# Patient Record
Sex: Male | Born: 1970 | Race: Black or African American | Hispanic: No | Marital: Married | Smoking: Never smoker
Health system: Southern US, Community
[De-identification: ages and names within clinical notes are randomized; demographics above are authoritative.]

## PROBLEM LIST (undated history)

## (undated) DIAGNOSIS — M5430 Sciatica, unspecified side: Secondary | ICD-10-CM

---

## 2016-08-02 ENCOUNTER — Ambulatory Visit (HOSPITAL_COMMUNITY)
Admission: EM | Admit: 2016-08-02 | Discharge: 2016-08-02 | Disposition: A | Discharge status: Home / Self Care | Payer: BLUE CROSS/BLUE SHIELD | Attending: Family Medicine | Admitting: Family Medicine

## 2016-08-02 DIAGNOSIS — S70362A Insect bite (nonvenomous), left thigh, initial encounter: Secondary | ICD-10-CM

## 2016-08-02 DIAGNOSIS — W57XXXA Bitten or stung by nonvenomous insect and other nonvenomous arthropods, initial encounter: Secondary | ICD-10-CM | POA: Diagnosis not present

## 2016-08-02 NOTE — Discharge Instructions (Signed)
Keep the area clean with soap and water. If you develop increased redness, itching or local swelling he may apply hydrocortisone 1% cream and/or Benadryl cream. Apply ice, packs to the area as well. If in the next few days she developed fever, chills, weakness or symptoms similar to a flulike illness seek medical attention promptly.

## 2016-08-02 NOTE — ED Triage Notes (Signed)
Pt states that he has a spider bite on his left thigh pt noticed it 2 days ago some swelling and redness pt does not know what type of spider it was

## 2016-08-02 NOTE — ED Provider Notes (Signed)
CSN: [id number]     Arrival date & time 08/02/16  1342 History   First MD Initiated Contact with Patient 08/02/16 1527     Chief Complaint  Patient presents with  . Insect Bite   (Consider location/radiation/quality/duration/timing/severity/associated sxs/prior Treatment) 46 year old male complains of a spider bite to the left proximal lateral thigh occurring 3 days ago. He works outside as a Administratorlandscaper. He never saw a spider or any other insect. He did feel something bite him though. He denies systemic symptoms. Denies feeling bad, no fever, chills causing all nausea or vomiting or other constitutional symptoms      No past medical history on file. No past surgical history on file. No family history on file. Social History  Substance Use Topics  . Smoking status: Not on file  . Smokeless tobacco: Not on file  . Alcohol use Not on file    Review of Systems  Constitutional: Negative.   HENT: Negative.   Respiratory: Negative for shortness of breath and wheezing.   Gastrointestinal: Negative.   Skin: Negative for rash.  Neurological: Negative for dizziness and headaches.  All other systems reviewed and are negative.   Allergies  Patient has no known allergies.  Home Medications   Prior to Admission medications   Not on File   Meds Ordered and Administered this Visit  Medications - No data to display  BP 123/71 (BP Location: Left Arm)   Pulse 63   Temp 98.3 F (36.8 C) (Oral)   SpO2 100%  No data found.   Physical Exam  Constitutional: He is oriented to person, place, and time. He appears well-developed and well-nourished. No distress.  Eyes: EOM are normal.  Neck: Normal range of motion. Neck supple.  Cardiovascular: Normal rate and regular rhythm.   Pulmonary/Chest: Effort normal and breath sounds normal. No respiratory distress.  Musculoskeletal: He exhibits no edema.  Neurological: He is alert and oriented to person, place, and time. He exhibits normal  muscle tone.  Skin: Skin is warm and dry.  Patient points to a small area with hair curling around it to the left lateral thigh. No actual lesion is seen by the provider. No erythema, no raised area, no draining, no bleeding, no pustule or papule. No cellulitis or other signs of infection. Approximately 3 cm medially there is a small 1 mm warty-like structure with hair growing through it. This does not appear to be any sort of bite.  Psychiatric: He has a normal mood and affect.  Nursing note and vitals reviewed.   Urgent Care Course     Procedures (including critical care time)  Labs Review Labs Reviewed - No data to display  Imaging Review No results found.   Visual Acuity Review  Right Eye Distance:   Left Eye Distance:   Bilateral Distance:    Right Eye Near:   Left Eye Near:    Bilateral Near:         MDM   1. Insect bite, initial encounter    Keep the area clean with soap and water. If you develop increased redness, itching or local swelling he may apply hydrocortisone 1% cream and/or Benadryl cream. Apply ice, packs to the area as well. If in the next few days she developed fever, chills, weakness or symptoms similar to a flulike illness seek medical attention promptly.     Hayden RasmussenMabe, Jezelle Gullick, NP 08/02/16 [phone]

## 2018-10-11 ENCOUNTER — Ambulatory Visit (HOSPITAL_COMMUNITY)
Admission: EM | Admit: 2018-10-11 | Discharge: 2018-10-11 | Disposition: A | Discharge status: Home / Self Care | Payer: BC Managed Care – PPO | Attending: Family Medicine | Admitting: Family Medicine

## 2018-10-11 ENCOUNTER — Other Ambulatory Visit: Payer: Self-pay

## 2018-10-11 ENCOUNTER — Encounter (HOSPITAL_COMMUNITY): Payer: Self-pay

## 2018-10-11 DIAGNOSIS — L03011 Cellulitis of right finger: Secondary | ICD-10-CM | POA: Diagnosis not present

## 2018-10-11 MED ORDER — LIDOCAINE HCL 2 % IJ SOLN
INTRAMUSCULAR | Status: AC
  Filled 2018-10-11: qty 20

## 2018-10-11 NOTE — ED Provider Notes (Signed)
[facility]    CSN: [id number] Arrival date & time: 10/11/18  1839      History   Chief Complaint Chief Complaint  Patient presents with  . Hand Pain    HPI Malik Boone is a 48 y.o. male.   He is presenting with right index finger pain.  He is having swelling and pain around the nail of the index finger.  Is been ongoing for a few days.  Pain seems to be getting worse.  Pain is becoming more constant.  Is localized to the dorsal aspect of the distal phalanx.  No overlying redness or streaking.  No injury or inciting event.  Has not take anything for the pain.  HPI  History reviewed. No pertinent past medical history.  There are no active problems to display for this patient.   History reviewed. No pertinent surgical history.     Home Medications    Prior to Admission medications   Not on File    Family History Family History  Problem Relation Age of Onset  . Healthy Mother     Social History Social History   Tobacco Use  . Smoking status: Never Smoker  . Smokeless tobacco: Never Used  Substance Use Topics  . Alcohol use: Yes    Comment: socially  . Drug use: Never     Allergies   Patient has no known allergies.   Review of Systems Review of Systems  Constitutional: Negative for fever.  HENT: Negative for congestion.   Respiratory: Negative for cough.   Cardiovascular: Negative for chest pain.  Gastrointestinal: Negative for abdominal pain.  Musculoskeletal: Negative for gait problem.  Skin: Negative for color change.  Neurological: Negative for weakness.  Hematological: Negative for adenopathy.     Physical Exam Triage Vital Signs ED Triage Vitals  Enc Vitals Group     BP 10/11/18 1858 126/68     Pulse --      Resp 10/11/18 1858 16     Temp 10/11/18 1858 98.2 F (36.8 C)     Temp Source 10/11/18 1858 Oral     SpO2 10/11/18 1858 100 %     Weight --      Height --      Head Circumference --      Peak Flow --      Pain  Score 10/11/18 1856 7     Pain Loc --      Pain Edu? --      Excl. in GC? --    No data found.  Updated Vital Signs BP 126/68 (BP Location: Left Arm)   Temp 98.2 F (36.8 C) (Oral)   Resp 16   SpO2 100%   Visual Acuity Right Eye Distance:   Left Eye Distance:   Bilateral Distance:    Right Eye Near:   Left Eye Near:    Bilateral Near:     Physical Exam Gen: NAD, alert, cooperative with exam, well-appearing ENT: normal lips, normal nasal mucosa,  Eye: normal EOM, normal conjunctiva and lids CV:  no edema, +2 pedal pulses   Resp: no accessory muscle use, non-labored,  Skin: no rashes, no areas of induration  Neuro: normal tone, normal sensation to touch Psych:  normal insight, alert and oriented MSK:  Right hand: Tenderness and fluctuance on the radial aspect of the nail. No streaking. Normal finger range of motion. No malrotation or misalignment. Neurovascular intact  Incision and Drainage Procedure Note:  The affected area was cleaned  and draped in a sterile fashion. Anesthesia was achieved using 6 mL of 1% Lidocaine without epinephrine injected to achieve a digital block of the right index finger using a 25-guage 1.5 inch needle. An 11-blade scalpel was used to incise the wound. A culture was obtained.  A Q-tip was used to break up any loculations. A sterile dressing was applied to the area. The patient tolerated the procedure well. No complications were encountered.     UC Treatments / Results  Labs (all labs ordered are listed, but only abnormal results are displayed) Labs Reviewed  AEROBIC CULTURE (SUPERFICIAL SPECIMEN)    EKG   Radiology No results found.  Procedures Procedures (including critical care time)  Medications Ordered in UC Medications  lidocaine (XYLOCAINE) 2 % (with pres) injection (has no administration in time range)    Initial Impression / Assessment and Plan / UC Course  I have reviewed the triage vital signs and the nursing  notes.  Pertinent labs & imaging results that were available during my care of the patient were reviewed by me and considered in my medical decision making (see chart for details).     Malik Boone is a 48 year old male that is presenting with symptoms consistent with paronychia.  Drainage was achieved today and wound culture was sent.  Counseled on supportive care.  Given indications return to follow-up.  Final Clinical Impressions(s) / UC Diagnoses   Final diagnoses:  Paronychia of finger of right hand     Discharge Instructions     Please soak the area in water and soap  Please keep it clean      ED Prescriptions    None     Controlled Substance Prescriptions [place] Controlled Substance Registry consulted? Not Applicable   Malik Ax, MD 10/11/18 2007

## 2018-10-11 NOTE — ED Triage Notes (Signed)
Patient presents to Urgent Care with complaints of right index finger apin since last week. 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

## 2018-10-11 NOTE — Discharge Instructions (Signed)
Please soak the area in water and soap  Please keep it clean

## 2018-10-14 LAB — AEROBIC CULTURE W GRAM STAIN (SUPERFICIAL SPECIMEN)

## 2018-10-15 ENCOUNTER — Telehealth (HOSPITAL_COMMUNITY): Payer: Self-pay | Admitting: Emergency Medicine

## 2018-10-15 NOTE — Telephone Encounter (Signed)
Attempted to reach patient to see how he was doing, no answer, left VM to return call if not feeling better.

## 2018-10-28 ENCOUNTER — Other Ambulatory Visit: Payer: Self-pay

## 2018-10-28 DIAGNOSIS — Z20822 Contact with and (suspected) exposure to covid-19: Secondary | ICD-10-CM

## 2018-10-29 LAB — NOVEL CORONAVIRUS, NAA: SARS-CoV-2, NAA: NOT DETECTED

## 2020-08-01 ENCOUNTER — Encounter (HOSPITAL_COMMUNITY): Payer: Self-pay

## 2020-08-01 ENCOUNTER — Ambulatory Visit (HOSPITAL_COMMUNITY)
Admission: EM | Admit: 2020-08-01 | Discharge: 2020-08-01 | Disposition: A | Discharge status: Home / Self Care | Payer: No Typology Code available for payment source | Attending: Physician Assistant | Admitting: Physician Assistant

## 2020-08-01 ENCOUNTER — Other Ambulatory Visit: Payer: Self-pay

## 2020-08-01 DIAGNOSIS — R1010 Upper abdominal pain, unspecified: Secondary | ICD-10-CM | POA: Insufficient documentation

## 2020-08-01 DIAGNOSIS — R1013 Epigastric pain: Secondary | ICD-10-CM | POA: Insufficient documentation

## 2020-08-01 LAB — COMPREHENSIVE METABOLIC PANEL
ALT: 14 U/L (ref 0–44)
AST: 21 U/L (ref 15–41)
Albumin: 4.1 g/dL (ref 3.5–5.0)
Alkaline Phosphatase: 50 U/L (ref 38–126)
Anion gap: 8 (ref 5–15)
BUN: 20 mg/dL (ref 6–20)
CO2: 26 mmol/L (ref 22–32)
Calcium: 9.3 mg/dL (ref 8.9–10.3)
Chloride: 104 mmol/L (ref 98–111)
Creatinine, Ser: 1.36 mg/dL — ABNORMAL HIGH (ref 0.61–1.24)
GFR, Estimated: >60 mL/min (ref >60)
Glucose, Bld: 88 mg/dL (ref 70–99)
Potassium: 3.9 mmol/L (ref 3.5–5.1)
Sodium: 138 mmol/L (ref 135–145)
Total Bilirubin: 0.8 mg/dL (ref 0.3–1.2)
Total Protein: 6.9 g/dL (ref 6.5–8.1)

## 2020-08-01 LAB — CBC WITH DIFFERENTIAL/PLATELET
Abs Immature Granulocytes: 0.01 10*3/uL (ref 0.00–0.07)
Basophils Absolute: 0 10*3/uL (ref 0.0–0.1)
Basophils Relative: 1 %
Eosinophils Absolute: 0 10*3/uL (ref 0.0–0.5)
Eosinophils Relative: 1 %
HCT: 42.1 % (ref 39.0–52.0)
Hemoglobin: 13.9 g/dL (ref 13.0–17.0)
Immature Granulocytes: 0 %
Lymphocytes Relative: 38 %
Lymphs Abs: 1.1 10*3/uL (ref 0.7–4.0)
MCH: 28.1 pg (ref 26.0–34.0)
MCHC: 33 g/dL (ref 30.0–36.0)
MCV: 85.1 fL (ref 80.0–100.0)
Monocytes Absolute: 0.3 10*3/uL (ref 0.1–1.0)
Monocytes Relative: 11 %
Neutro Abs: 1.4 10*3/uL — ABNORMAL LOW (ref 1.7–7.7)
Neutrophils Relative %: 49 %
Platelets: 211 10*3/uL (ref 150–400)
RBC: 4.95 MIL/uL (ref 4.22–5.81)
RDW: 15.4 % (ref 11.5–15.5)
WBC: 2.8 10*3/uL — ABNORMAL LOW (ref 4.0–10.5)
nRBC: 0 % (ref 0.0–0.2)

## 2020-08-01 LAB — POCT URINALYSIS DIPSTICK, ED / UC
Bilirubin Urine: NEGATIVE
Glucose, UA: NEGATIVE mg/dL
Hgb urine dipstick: NEGATIVE
Leukocytes,Ua: NEGATIVE
Nitrite: NEGATIVE
Protein, ur: NEGATIVE mg/dL
Specific Gravity, Urine: >=1.03 (ref 1.005–1.030)
Urobilinogen, UA: 0.2 mg/dL (ref 0.0–1.0)
pH: 5.5 (ref 5.0–8.0)

## 2020-08-01 LAB — LIPASE, BLOOD: Lipase: 37 U/L (ref 11–51)

## 2020-08-01 MED ORDER — FAMOTIDINE 40 MG PO TABS: 40.0000 mg | ORAL_TABLET | Freq: Every day | ORAL | 0 refills | Status: AC

## 2020-08-01 NOTE — ED Triage Notes (Signed)
C/o abdominal pain (aching), sharp pain in head (intermitten), Blurred vision (no change from past), denies shoulder, jaw and chest pain. LBM: today  Started: last week (Tuesday) Interventions: pepto bismol with some relief

## 2020-08-01 NOTE — Discharge Instructions (Addendum)
Your urine showed that you are not eating enough but is otherwise normal.  Your white blood cells were slightly low which can be a result of a viral infection.  I do recommend that you have this rechecked within a week.  If you develop any fever, nausea/vomiting, weakness, lightheadedness, cough, urinary symptoms you need to go to the emergency room.  Start Pepcid to help coat your stomach.  We will contact you if your additional lab work is abnormal.  Please avoid spicy/acidic/fatty foods and drink plenty of water.  I recommend eating small frequent meals that are easy to digest.  If you have any worsening symptoms as we discussed you need to go to the emergency room for imaging.  Avoid alcohol and ibuprofen like medications (NSAIDs).

## 2020-08-01 NOTE — ED Provider Notes (Signed)
[facility]    CSN: [id number] Arrival date & time: 08/01/20  0900      History   Chief Complaint Chief Complaint  Patient presents with   Abdominal Pain   Pain to head    HPI Malik Boone is a 50 y.o. male.   Patient presents today with a weeklong history of intermittent upper abdominal pain.  He reports pain is currently rated 3 on a 0-10 pain scale but increases to a 7 at different times, localized to epigastrium and throughout upper abdomen, described as aching, no aggravating or alleviating factors identified.  Patient has not identified any triggers.  He has been eating a bland diet which provided some relief of symptoms.  He denies any nausea, vomiting, diarrhea, melena, hematochezia, dysphagia, odynophagia.  He denies previous abdominal surgery.  Denies history of gastrointestinal disorder including Crohn's disease, peptic ulcer disease, GERD.  He has tried Pepto-Bismol which provided relief of symptoms temporarily.  He has not seen a GI specialist in the past.  He denies regular caffeine, NSAID, alcohol consumption.  He does not have a primary care provider but intends to establish with 1 in the near future.  He has been able to eat and drink despite symptoms.   History reviewed. No pertinent past medical history.  There are no problems to display for this patient.   History reviewed. No pertinent surgical history.     Home Medications    Prior to Admission medications   Medication Sig Start Date End Date Taking? Authorizing Provider  famotidine (PEPCID) 40 MG tablet Take 1 tablet (40 mg total) by mouth at bedtime. 08/01/20  Yes Crewe Heathman, Noberto Retort, PA-C    Family History Family History  Problem Relation Age of Onset   Healthy Mother     Social History Social History   Tobacco Use   Smoking status: Never   Smokeless tobacco: Never  Vaping Use   Vaping Use: Never used  Substance Use Topics   Alcohol use: Yes    Comment: socially   Drug use: Never      Allergies   Patient has no known allergies.   Review of Systems Review of Systems  Constitutional:  Positive for activity change. Negative for appetite change, fatigue and fever.  Respiratory:  Negative for cough and shortness of breath.   Cardiovascular:  Negative for chest pain.  Gastrointestinal:  Positive for abdominal pain. Negative for blood in stool, constipation, diarrhea, nausea and vomiting.  Genitourinary:  Negative for dysuria, frequency, hematuria and urgency.  Musculoskeletal:  Negative for arthralgias and myalgias.  Neurological:  Positive for headaches (intermittent). Negative for dizziness and light-headedness.    Physical Exam Triage Vital Signs ED Triage Vitals  Enc Vitals Group     BP 08/01/20 1010 118/78     Pulse Rate 08/01/20 1010 68     Resp 08/01/20 1010 16     Temp 08/01/20 1010 98.3 F (36.8 C)     Temp Source 08/01/20 1010 Oral     SpO2 08/01/20 1010 97 %     Weight --      Height --      Head Circumference --      Peak Flow --      Pain Score 08/01/20 1006 7     Pain Loc --      Pain Edu? --      Excl. in GC? --    No data found.  Updated Vital Signs BP 118/78 (BP Location:  Left Arm)   Pulse 68   Temp 98.3 F (36.8 C) (Oral)   Resp 16   SpO2 97%   Visual Acuity Right Eye Distance:   Left Eye Distance:   Bilateral Distance:    Right Eye Near:   Left Eye Near:    Bilateral Near:     Physical Exam Vitals reviewed.  Constitutional:      General: He is awake.     Appearance: Normal appearance. He is normal weight. He is not ill-appearing.     Comments: Very pleasant male appears stated age in no acute distress sitting comfortably on exam table  HENT:     Head: Normocephalic and atraumatic.  Cardiovascular:     Rate and Rhythm: Normal rate and regular rhythm.     Heart sounds: Normal heart sounds, S1 normal and S2 normal. No murmur heard. Pulmonary:     Effort: Pulmonary effort is normal.     Breath sounds: Normal  breath sounds. No stridor. No wheezing, rhonchi or rales.     Comments: Clear to auscultation bilaterally Abdominal:     General: Bowel sounds are normal.     Palpations: Abdomen is soft.     Tenderness: There is no abdominal tenderness. There is no right CVA tenderness, left CVA tenderness, guarding or rebound. Negative signs include Murphy's sign.     Comments: Benign abdominal exam.  No evidence of acute abdomen on physical exam.  No tenderness palpation. Negative Murphy sign.    Neurological:     Mental Status: He is alert.  Psychiatric:        Behavior: Behavior is cooperative.     UC Treatments / Results  Labs (all labs ordered are listed, but only abnormal results are displayed) Labs Reviewed  CBC WITH DIFFERENTIAL/PLATELET - Abnormal; Notable for the following components:      Result Value   WBC 2.8 (*)    Neutro Abs 1.4 (*)    All other components within normal limits  POCT URINALYSIS DIPSTICK, ED / UC - Abnormal; Notable for the following components:   Ketones, ur TRACE (*)    All other components within normal limits  COMPREHENSIVE METABOLIC PANEL  LIPASE, BLOOD    EKG   Radiology No results found.  Procedures Procedures (including critical care time)  Medications Ordered in UC Medications - No data to display  Initial Impression / Assessment and Plan / UC Course  I have reviewed the triage vital signs and the nursing notes.  Pertinent labs & imaging results that were available during my care of the patient were reviewed by me and considered in my medical decision making (see chart for details).      UA showed trace ketones but was otherwise normal.  CBC obtained showed leukopenia without additional abnormalities.  CMP and lipase pending.  We will start patient on Pepcid and he was instructed to eat a bland diet and drink plenty of fluid.  Discussed that we are unable to obtain imaging in urgent care and if he has any persistent or worsening symptoms he  needs to go to the emergency room.  He was given contact information for local GI specialist should symptoms persist but not worsen.  Discussed alarm symptoms that warrant emergent evaluation.  Strict return precautions given to which patient expressed understanding.  Final Clinical Impressions(s) / UC Diagnoses   Final diagnoses:  Upper abdominal pain  Epigastric abdominal pain     Discharge Instructions      Your  urine showed that urinating otherwise normal.  Your white blood cells were slightly low which can be a result of a viral infection.  I do recommend that you have this rechecked within a week.  If you develop any fever, nausea/vomiting, weakness, lightheadedness, cough, urinary symptoms you need to go to the emergency room.  Start Pepcid to help coat your stomach.  We will contact you if your additional lab work is abnormal.  Please avoid spicy/acidic/fatty foods and drink plenty of water.  I recommend eating small frequent meals that are easy to digest.  If you have any worsening symptoms as we discussed you need to go to the emergency room for imaging.  Avoid alcohol and ibuprofen like medications (NSAIDs).     ED Prescriptions     Medication Sig Dispense Auth. Provider   famotidine (PEPCID) 40 MG tablet Take 1 tablet (40 mg total) by mouth at bedtime. 30 tablet Lindia Garms, Noberto Retort, PA-C      PDMP not reviewed this encounter.   Jeani Hawking, PA-C 08/01/20 1218

## 2020-08-14 ENCOUNTER — Ambulatory Visit (HOSPITAL_COMMUNITY)
Admission: EM | Admit: 2020-08-14 | Discharge: 2020-08-14 | Disposition: A | Discharge status: Home / Self Care | Payer: No Typology Code available for payment source | Attending: Emergency Medicine | Admitting: Emergency Medicine

## 2020-08-14 ENCOUNTER — Other Ambulatory Visit: Payer: Self-pay

## 2020-08-14 ENCOUNTER — Encounter (HOSPITAL_COMMUNITY): Payer: Self-pay

## 2020-08-14 ENCOUNTER — Ambulatory Visit (INDEPENDENT_AMBULATORY_CARE_PROVIDER_SITE_OTHER): Payer: No Typology Code available for payment source

## 2020-08-14 DIAGNOSIS — M79671 Pain in right foot: Secondary | ICD-10-CM

## 2020-08-14 NOTE — ED Triage Notes (Signed)
Pt presents with right foot pain X 1 week.   States his foot has been very tender.

## 2020-08-14 NOTE — ED Provider Notes (Signed)
[facility]    CSN: [id number] Arrival date & time: 08/14/20  0850      History   Chief Complaint Chief Complaint  Patient presents with   Foot Pain    HPI Malik Boone is a 50 y.o. male.   1 week ago noticed pain to rt foot on bottom side under small toe. States that his wife used tweezers to pick and remove something . Pt denies any known injury. It is worse when he wears his work boots. Denies any fevers. Has not taken anything pta.    History reviewed. No pertinent past medical history.  There are no problems to display for this patient.   History reviewed. No pertinent surgical history.     Home Medications    Prior to Admission medications   Medication Sig Start Date End Date Taking? Authorizing Provider  famotidine (PEPCID) 40 MG tablet Take 1 tablet (40 mg total) by mouth at bedtime. 08/01/20   Raspet, Noberto Retort, PA-C    Family History Family History  Problem Relation Age of Onset   Healthy Mother     Social History Social History   Tobacco Use   Smoking status: Never   Smokeless tobacco: Never  Vaping Use   Vaping Use: Never used  Substance Use Topics   Alcohol use: Yes    Comment: socially   Drug use: Never     Allergies   Patient has no known allergies.   Review of Systems Review of Systems  Constitutional: Negative.   Respiratory: Negative.    Cardiovascular: Negative.   Gastrointestinal: Negative.   Genitourinary: Negative.   Skin:  Positive for wound.       Bottom of rt foot small area on ball of foot pin point area painful to touch,   Neurological: Negative.     Physical Exam Triage Vital Signs ED Triage Vitals  Enc Vitals Group     BP 08/14/20 1000 124/70     Pulse Rate 08/14/20 1000 63     Resp 08/14/20 1000 17     Temp 08/14/20 1000 98.3 F (36.8 C)     Temp Source 08/14/20 1000 Oral     SpO2 08/14/20 1000 100 %     Weight --      Height --      Head Circumference --      Peak Flow --      Pain Score  08/14/20 0959 5     Pain Loc --      Pain Edu? --      Excl. in GC? --    No data found.  Updated Vital Signs BP 124/70 (BP Location: Right Arm)   Pulse 63   Temp 98.3 F (36.8 C) (Oral)   Resp 17   SpO2 100%   Visual Acuity     Physical Exam Constitutional:      Appearance: Normal appearance.  Cardiovascular:     Rate and Rhythm: Normal rate.  Pulmonary:     Effort: Pulmonary effort is normal.  Abdominal:     General: Abdomen is flat.  Musculoskeletal:        General: Swelling and tenderness present.  Skin:    Capillary Refill: Capillary refill takes less than 2 seconds.     Comments: Small pin point area to ball of rt foot below small digit, slight edema, pink in color, tender to surrounding tissues area. Not able to apply pressure warm to touch   Neurological:  Mental Status: He is alert.     UC Treatments / Results  Labs (all labs ordered are listed, but only abnormal results are displayed) Labs Reviewed - No data to display  EKG   Radiology DG Foot Complete Right  Result Date: 08/14/2020 CLINICAL DATA:  Right foot pain for the past week. EXAM: RIGHT FOOT COMPLETE - 3+ VIEW COMPARISON:  None. FINDINGS: There is no evidence of fracture or dislocation. There is no evidence of arthropathy or other focal bone abnormality. Soft tissues are unremarkable. No radiopaque foreign body identified. IMPRESSION: Negative. Electronically Signed   By: Obie Dredge M.D.   On: 08/14/2020 11:33    Procedures Procedures (including critical care time)  Medications Ordered in UC Medications - No data to display  Initial Impression / Assessment and Plan / UC Course  I have reviewed the triage vital signs and the nursing notes.  Pertinent labs & imaging results that were available during my care of the patient were reviewed by me and considered in my medical decision making (see chart for details).     Warm compress  Take tylenol or motrin as needed for pain  Do not  pick at the area   Final Clinical Impressions(s) / UC Diagnoses   Final diagnoses:  Foot pain, right     Discharge Instructions      Your x ray was negative  Take motrin as needed for pain  Avoid shoes that would rub on the area       ED Prescriptions   None    PDMP not reviewed this encounter.   Coralyn Mark, NP 08/14/20 1153

## 2020-08-14 NOTE — Discharge Instructions (Addendum)
Your x ray was negative  Take motrin as needed for pain  Avoid shoes that would rub on the area

## 2020-08-20 ENCOUNTER — Ambulatory Visit (HOSPITAL_COMMUNITY)
Admission: EM | Admit: 2020-08-20 | Discharge: 2020-08-20 | Disposition: A | Discharge status: Home / Self Care | Payer: No Typology Code available for payment source | Attending: Internal Medicine | Admitting: Internal Medicine

## 2020-08-20 ENCOUNTER — Encounter (HOSPITAL_COMMUNITY): Payer: Self-pay | Admitting: *Deleted

## 2020-08-20 ENCOUNTER — Other Ambulatory Visit: Payer: Self-pay

## 2020-08-20 ENCOUNTER — Ambulatory Visit (HOSPITAL_COMMUNITY): Payer: No Typology Code available for payment source

## 2020-08-20 DIAGNOSIS — M79671 Pain in right foot: Secondary | ICD-10-CM | POA: Diagnosis not present

## 2020-08-20 DIAGNOSIS — L03115 Cellulitis of right lower limb: Secondary | ICD-10-CM | POA: Diagnosis not present

## 2020-08-20 MED ORDER — CEPHALEXIN 500 MG PO CAPS: 500.0000 mg | ORAL_CAPSULE | Freq: Four times a day (QID) | ORAL | 0 refills | Status: AC

## 2020-08-20 NOTE — ED Triage Notes (Signed)
RT small toe pain and swelling. Worse since last week when Pt was seen at [facility].

## 2020-08-20 NOTE — Discharge Instructions (Addendum)
You are being treated for possible infection of your foot with cephalexin antibiotic.  Please take as prescribed.  Please follow-up with provided contact information for podiatrist for further evaluation and management of right foot pain/infection.  May continue warm compresses as well if needed.  Please keep lesion covered, clean, dry.

## 2020-08-20 NOTE — ED Provider Notes (Signed)
[facility]    CSN: [id number] Arrival date & time: 08/20/20  0849      History   Chief Complaint Chief Complaint  Patient presents with   Toe Pain    HPI Malik Boone is a 50 y.o. male.   Patient presents the urgent care for reevaluation of lesion to right foot.  Patient was evaluated in urgent care on 08/14/2020 for a lesion that was present to right foot directly below right fifth digit after possibly stepping on something.  Patient states that his wife removed something from his foot, but he is not sure what it was previous to original visit.  Has been doing warm compresses and ice application but lesion has not improved.  Denies any fevers or purulent drainage from lesion.  States that he has noticed increased swelling of this area of his foot.   Toe Pain   History reviewed. No pertinent past medical history.  There are no problems to display for this patient.   History reviewed. No pertinent surgical history.     Home Medications    Prior to Admission medications   Medication Sig Start Date End Date Taking? Authorizing Provider  cephALEXin (KEFLEX) 500 MG capsule Take 1 capsule (500 mg total) by mouth 4 (four) times daily for 5 days. 08/20/20 08/25/20 Yes Lance Muss, FNP  famotidine (PEPCID) 40 MG tablet Take 1 tablet (40 mg total) by mouth at bedtime. 08/01/20  Yes Raspet, Noberto Retort, PA-C    Family History Family History  Problem Relation Age of Onset   Healthy Mother     Social History Social History   Tobacco Use   Smoking status: Never   Smokeless tobacco: Never  Vaping Use   Vaping Use: Never used  Substance Use Topics   Alcohol use: Yes    Comment: socially   Drug use: Never     Allergies   Patient has no known allergies.   Review of Systems Review of Systems Per HPI  Physical Exam Triage Vital Signs ED Triage Vitals  Enc Vitals Group     BP 08/20/20 0941 125/70     Pulse Rate 08/20/20 0941 62     Resp 08/20/20 0941 20      Temp 08/20/20 0941 98.7 F (37.1 C)     Temp src --      SpO2 08/20/20 0941 100 %     Weight --      Height --      Head Circumference --      Peak Flow --      Pain Score 08/20/20 0942 5     Pain Loc --      Pain Edu? --      Excl. in GC? --    No data found.  Updated Vital Signs BP 125/70   Pulse 62   Temp 98.7 F (37.1 C)   Resp 20   SpO2 100%   Visual Acuity Right Eye Distance:   Left Eye Distance:   Bilateral Distance:    Right Eye Near:   Left Eye Near:    Bilateral Near:     Physical Exam Constitutional:      General: He is not in acute distress.    Appearance: Normal appearance. He is not ill-appearing.  HENT:     Head: Normocephalic and atraumatic.  Eyes:     Extraocular Movements: Extraocular movements intact.     Conjunctiva/sclera: Conjunctivae normal.  Pulmonary:     Effort:  Pulmonary effort is normal.  Skin:    General: Skin is warm and dry.     Findings: Lesion present.     Comments: Patient has approximately 1 inch in length vesicular, flat lesion to ball of foot directly beneath right fifth digit.  Mild erythema surrounding.  Neurological:     General: No focal deficit present.     Mental Status: He is alert and oriented to person, place, and time. Mental status is at baseline.  Psychiatric:        Mood and Affect: Mood normal.        Behavior: Behavior normal.        Thought Content: Thought content normal.        Judgment: Judgment normal.     UC Treatments / Results  Labs (all labs ordered are listed, but only abnormal results are displayed) Labs Reviewed - No data to display  EKG   Radiology No results found.  Procedures Procedures (including critical care time)  Medications Ordered in UC Medications - No data to display  Initial Impression / Assessment and Plan / UC Course  I have reviewed the triage vital signs and the nursing notes.  Pertinent labs & imaging results that were available during my care of the  patient were reviewed by me and considered in my medical decision making (see chart for details).     Lesion has worsened in comparison to previous provider's physical assessment documentation of lesion to foot.  Appears to have infection of area to foot.  I&D not indicated to vesicular lesion.  Does not appear to need immediate referral to podiatry or further evaluation ER at this time.  Will treat with cephalexin x5 days and refer to podiatry.  Patient advised that he may continue warm compresses.  Patient advised to monitor area closely for increased redness, swelling, purulent drainage.  Discussed strict return precautions. Patient verbalized understanding and is agreeable with plan.  Final Clinical Impressions(s) / UC Diagnoses   Final diagnoses:  Cellulitis of right foot  Right foot pain     Discharge Instructions      You are being treated for possible infection of your foot with cephalexin antibiotic.  Please take as prescribed.  Please follow-up with provided contact information for podiatrist for further evaluation and management of right foot pain/infection.  May continue warm compresses as well if needed.  Please keep lesion covered, clean, dry.     ED Prescriptions     Medication Sig Dispense Auth. Provider   cephALEXin (KEFLEX) 500 MG capsule Take 1 capsule (500 mg total) by mouth 4 (four) times daily for 5 days. 20 capsule Lance Muss, FNP      PDMP not reviewed this encounter.   Lance Muss, FNP 08/20/20 1034

## 2020-08-23 ENCOUNTER — Other Ambulatory Visit: Payer: Self-pay

## 2020-08-23 ENCOUNTER — Encounter: Payer: Self-pay | Admitting: Podiatry

## 2020-08-23 ENCOUNTER — Ambulatory Visit: Payer: No Typology Code available for payment source | Admitting: Podiatry

## 2020-08-23 DIAGNOSIS — L02611 Cutaneous abscess of right foot: Secondary | ICD-10-CM

## 2020-08-23 MED ORDER — IBUPROFEN 800 MG PO TABS: 800.0000 mg | ORAL_TABLET | Freq: Three times a day (TID) | ORAL | 0 refills | Status: AC | PRN

## 2020-08-23 MED ORDER — CEPHALEXIN 500 MG PO CAPS: 500.0000 mg | ORAL_CAPSULE | Freq: Three times a day (TID) | ORAL | 0 refills | Status: DC

## 2020-08-23 MED ORDER — MUPIROCIN 2 % EX OINT: 1.0000 "application " | TOPICAL_OINTMENT | Freq: Every day | CUTANEOUS | 2 refills | Status: AC

## 2020-08-23 NOTE — Patient Instructions (Signed)
Take the 800mg  ibuprofen every 8 hours. You may take 1,000mg  (2 extra strength) tylenol every 6 hours in addition to this

## 2020-08-26 ENCOUNTER — Encounter: Payer: Self-pay | Admitting: Podiatry

## 2020-08-26 LAB — WOUND CULTURE
MICRO NUMBER:: 12174815
SPECIMEN QUALITY:: ADEQUATE

## 2020-08-26 MED ORDER — SULFAMETHOXAZOLE-TRIMETHOPRIM 800-160 MG PO TABS: 1.0000 | ORAL_TABLET | Freq: Two times a day (BID) | ORAL | 0 refills | Status: AC

## 2020-08-26 NOTE — Progress Notes (Signed)
  Subjective:  Patient ID: Malik Boone, male    DOB: 1970/11/16,  MRN: [id number]  Chief Complaint  Patient presents with   Foot Swelling     (np) right foot pain, swelling     50 y.o. male presents with the above complaint. History confirmed with patient.  He stepped on something and it was taken out at [facility] urgent care on his right foot.  He has been on Keflex since Monday.  He is had quite a bit of swelling since then.  Objective:  Physical Exam: warm, good capillary refill, no trophic changes or ulcerative lesions, normal DP and PT pulses, and normal sensory exam.  Right Foot: Pain swelling and bulla formation plantar fifth MTPJ  Reviewed his radiographs from urgent care there is no evidence of radiopaque foreign body Assessment:   1. Abscess of right foot      Plan:  Patient was evaluated and treated and all questions answered.  After evaluating him I prepped the area of concern with povidone iodine solution and lanced the bulla and approximately 3 to 5 cc of purulence was expressed from this.  I took a culture and prescribed him further Keflex mupirocin and ibuprofen for pain control.  Addendum note I reviewed his wound cultures final results and it grew Enterobacter resistant to cefazolin.  Prescribed him Bactrim.  We will stop Keflex  Return in about 4 weeks (around 09/20/2020) for follow up on abscess .

## 2020-09-20 ENCOUNTER — Other Ambulatory Visit: Payer: Self-pay

## 2020-09-20 ENCOUNTER — Ambulatory Visit (INDEPENDENT_AMBULATORY_CARE_PROVIDER_SITE_OTHER): Payer: No Typology Code available for payment source | Admitting: Podiatry

## 2020-09-20 DIAGNOSIS — L02611 Cutaneous abscess of right foot: Secondary | ICD-10-CM | POA: Diagnosis not present

## 2020-09-24 NOTE — Progress Notes (Signed)
  Subjective:  Patient ID: Thoams Siefert, male    DOB: 27-Sep-1970,  MRN: [id number]  Chief Complaint  Patient presents with   Foot Ulcer     4 weeks  follow up on abscess right foot    50 y.o. male presents with the above complaint. History confirmed with patient.  Overall doing better he finished all the Bactrim  Objective:  Physical Exam: warm, good capillary refill, no trophic changes or ulcerative lesions, normal DP and PT pulses, and normal sensory exam.  Right Foot: Wound is fully healed no signs of infection currently  Reviewed his radiographs from urgent care there is no evidence of radiopaque foreign body Assessment:   1. Abscess of right foot       Plan:  Patient was evaluated and treated and all questions answered.  Overall doing well he has completed his antibiotics.  Expect this to heal uneventfully now.  Return as needed  No follow-ups on file.

## 2021-12-30 ENCOUNTER — Emergency Department (HOSPITAL_COMMUNITY): Payer: No Typology Code available for payment source

## 2021-12-30 ENCOUNTER — Emergency Department (HOSPITAL_COMMUNITY)
Admission: EM | Admit: 2021-12-30 | Discharge: 2021-12-30 | Disposition: A | Discharge status: Home / Self Care | Payer: No Typology Code available for payment source | Attending: Emergency Medicine | Admitting: Emergency Medicine

## 2021-12-30 DIAGNOSIS — M5432 Sciatica, left side: Secondary | ICD-10-CM | POA: Diagnosis not present

## 2021-12-30 DIAGNOSIS — M545 Low back pain, unspecified: Secondary | ICD-10-CM | POA: Diagnosis present

## 2021-12-30 MED ORDER — METHYLPREDNISOLONE 4 MG PO TBPK: ORAL_TABLET | ORAL | 0 refills | Status: AC

## 2021-12-30 MED ORDER — KETOROLAC TROMETHAMINE 15 MG/ML IJ SOLN
15.0000 mg | Freq: Once | INTRAMUSCULAR | Status: AC
  Administered 2021-12-30: 15 mg via INTRAMUSCULAR
  Filled 2021-12-30: qty 1

## 2021-12-30 NOTE — ED Provider Notes (Signed)
[facility]-EMERGENCY DEPT Provider Note   CSN: [id number] Arrival date & time: 12/30/21  0051     History  Chief Complaint  Patient presents with   Leg Pain    Malik Boone is a 51 y.o. male.  51 year old male with prior medical history as detailed below presents for evaluation.  Patient complains of left-sided low back pain.  This has been an ongoing issue for the last 1 to 2 months.  Patient reports that the pain is intermittent.  Pain is exacerbated with heavy lifting.  Patient works as a Administrator.  Patient reports that his pain will intermittently shoot down his left leg to his ankle.  Patient reports that using ibuprofen at home alleviates his pain significantly.  Patient denies any specific known trauma.  He denies associated fever, urinary symptoms, change in bowel movements, numbness or tingling to his lower extremities, etc.  The history is provided by the patient and medical records.       Home Medications Prior to Admission medications   Medication Sig Start Date End Date Taking? Authorizing Provider  famotidine (PEPCID) 40 MG tablet Take 1 tablet (40 mg total) by mouth at bedtime. 08/01/20   Raspet, Noberto Retort, PA-C  ibuprofen (ADVIL) 800 MG tablet Take 1 tablet (800 mg total) by mouth every 8 (eight) hours as needed. 08/23/20   McDonald, Rachelle Hora, DPM  mupirocin ointment (BACTROBAN) 2 % Apply 1 application topically daily. 08/23/20   Edwin Cap, DPM      Allergies    Patient has no known allergies.    Review of Systems   Review of Systems  All other systems reviewed and are negative.   Physical Exam Updated Vital Signs BP 117/87   Pulse 76   Temp 98.5 F (36.9 C) (Oral)   Resp 18   Ht 6\' 4"  (1.93 m)   Wt 77.1 kg   SpO2 99%   BMI 20.69 kg/m  Physical Exam Vitals and nursing note reviewed.  Constitutional:      General: He is not in acute distress.    Appearance: Normal appearance. He is well-developed.  HENT:     Head:  Normocephalic and atraumatic.  Eyes:     Conjunctiva/sclera: Conjunctivae normal.     Pupils: Pupils are equal, round, and reactive to light.  Cardiovascular:     Rate and Rhythm: Normal rate and regular rhythm.     Heart sounds: Normal heart sounds.  Pulmonary:     Effort: Pulmonary effort is normal. No respiratory distress.     Breath sounds: Normal breath sounds.  Abdominal:     General: There is no distension.     Palpations: Abdomen is soft.     Tenderness: There is no abdominal tenderness.  Musculoskeletal:        General: No deformity. Normal range of motion.     Cervical back: Normal range of motion and neck supple.     Comments: Localizes pain to mid left buttock - no appreciable mass, swelling, skin change noted.   Skin:    General: Skin is warm and dry.  Neurological:     General: No focal deficit present.     Mental Status: He is alert and oriented to person, place, and time.     ED Results / Procedures / Treatments   Labs (all labs ordered are listed, but only abnormal results are displayed) Labs Reviewed - No data to display  EKG None  Radiology No results found.  Procedures Procedures    Medications Ordered in ED Medications  ketorolac (TORADOL) 15 MG/ML injection 15 mg (has no administration in time range)    ED Course/ Medical Decision Making/ A&P                           Medical Decision Making Amount and/or Complexity of Data Reviewed Radiology: ordered.  Risk Prescription drug management.    Medical Screen Complete  This patient presented to the ED with complaint of back pain.  This complaint involves an extensive number of treatment options. The initial differential diagnosis includes, but is not limited to, sciatica, etc.  This presentation is: Chronic, Self-Limited, Previously Undiagnosed, Uncertain Prognosis, Complicated, and Systemic Symptoms  Patient is presenting with complaint of left-sided low back discomfort.   Presentation is consistent with likely sciatica.  Patient is requesting x-rays.  Imaging does not reveal evidence of acute pathology.  Patient and patient's wife educated about the reasons for sciatica, the routine outpatient management of sciatica, and the important need for close follow-up with the patient's PCP.  Strict return precautions given and understood.  Importance of close follow-up stressed.   Additional history obtained:  Additional history obtained from Spouse External records from outside sources obtained and reviewed including prior ED visits and prior Inpatient records.   Imaging Studies ordered:  I ordered imaging studies including plain films of lumbar spine I independently visualized and interpreted obtained imaging which showed NAD I agree with the radiologist interpretation.   Medicines ordered:  I ordered medication including Toradol for pain Reevaluation of the patient after these medicines showed that the patient: improved Problem List / ED Course:  Sciatica   Reevaluation:  After the interventions noted above, I reevaluated the patient and found that they have: improved  Disposition:  After consideration of the diagnostic results and the patients response to treatment, I feel that the patent would benefit from close outpatient follow-up.          Final Clinical Impression(s) / ED Diagnoses Final diagnoses:  Sciatica of left side    Rx / DC Orders ED Discharge Orders          Ordered    methylPREDNISolone (MEDROL DOSEPAK) 4 MG TBPK tablet        12/30/21 1116              Wynetta Fines, MD 12/30/21 1140

## 2021-12-30 NOTE — Discharge Instructions (Addendum)
Return for any problem.  X-ray of your lumbar spine obtained today does not reveal broken bones or fracture.  You have mild degenerative changes (arthritic changes) noted on the x-ray.  This likely is contributing to your symptoms of sciatica.  As discussed, use ibuprofen for pain.  Trial use of Solu-Medrol Dosepak for pain control and anti-inflammatory effect.  Continue to stretch as discussed.  Follow-up closely with your regular outpatient care provider.

## 2021-12-30 NOTE — ED Triage Notes (Signed)
Patient arrived with complaints of left sided pain that shoots from buttock down to ankle over the last month, reports worsens when laying down.

## 2022-01-16 ENCOUNTER — Other Ambulatory Visit: Payer: Self-pay | Admitting: Internal Medicine

## 2022-01-16 DIAGNOSIS — M5430 Sciatica, unspecified side: Secondary | ICD-10-CM

## 2022-01-23 ENCOUNTER — Emergency Department (HOSPITAL_COMMUNITY): Payer: No Typology Code available for payment source

## 2022-01-23 ENCOUNTER — Other Ambulatory Visit: Payer: Self-pay

## 2022-01-23 ENCOUNTER — Emergency Department (HOSPITAL_BASED_OUTPATIENT_CLINIC_OR_DEPARTMENT_OTHER)
Admit: 2022-01-23 | Discharge: 2022-01-23 | Disposition: A | Discharge status: Home / Self Care | Payer: No Typology Code available for payment source

## 2022-01-23 ENCOUNTER — Encounter (HOSPITAL_COMMUNITY): Payer: Self-pay

## 2022-01-23 ENCOUNTER — Emergency Department (HOSPITAL_COMMUNITY)
Admission: EM | Admit: 2022-01-23 | Discharge: 2022-01-23 | Disposition: A | Discharge status: Home / Self Care | Payer: No Typology Code available for payment source | Attending: Emergency Medicine | Admitting: Emergency Medicine

## 2022-01-23 DIAGNOSIS — R0602 Shortness of breath: Secondary | ICD-10-CM | POA: Insufficient documentation

## 2022-01-23 DIAGNOSIS — R52 Pain, unspecified: Secondary | ICD-10-CM | POA: Diagnosis not present

## 2022-01-23 DIAGNOSIS — R079 Chest pain, unspecified: Secondary | ICD-10-CM | POA: Insufficient documentation

## 2022-01-23 DIAGNOSIS — M79605 Pain in left leg: Secondary | ICD-10-CM | POA: Insufficient documentation

## 2022-01-23 DIAGNOSIS — R350 Frequency of micturition: Secondary | ICD-10-CM | POA: Diagnosis not present

## 2022-01-23 HISTORY — DX: Sciatica, unspecified side: M54.30

## 2022-01-23 LAB — URINALYSIS, ROUTINE W REFLEX MICROSCOPIC
Bilirubin Urine: NEGATIVE
Glucose, UA: NEGATIVE mg/dL
Hgb urine dipstick: NEGATIVE
Ketones, ur: NEGATIVE mg/dL
Leukocytes,Ua: NEGATIVE
Nitrite: NEGATIVE
Protein, ur: NEGATIVE mg/dL
Specific Gravity, Urine: <1.005 — ABNORMAL LOW (ref 1.005–1.030)
pH: 5.5 (ref 5.0–8.0)

## 2022-01-23 LAB — BASIC METABOLIC PANEL
Anion gap: 7 (ref 5–15)
BUN: 18 mg/dL (ref 6–20)
CO2: 25 mmol/L (ref 22–32)
Calcium: 9 mg/dL (ref 8.9–10.3)
Chloride: 105 mmol/L (ref 98–111)
Creatinine, Ser: 1.04 mg/dL (ref 0.61–1.24)
GFR, Estimated: >60 mL/min (ref >60)
Glucose, Bld: 97 mg/dL (ref 70–99)
Potassium: 4.2 mmol/L (ref 3.5–5.1)
Sodium: 137 mmol/L (ref 135–145)

## 2022-01-23 LAB — CBC
HCT: 42.6 % (ref 39.0–52.0)
Hemoglobin: 14 g/dL (ref 13.0–17.0)
MCH: 28.1 pg (ref 26.0–34.0)
MCHC: 32.9 g/dL (ref 30.0–36.0)
MCV: 85.5 fL (ref 80.0–100.0)
Platelets: 208 10*3/uL (ref 150–400)
RBC: 4.98 MIL/uL (ref 4.22–5.81)
RDW: 16.2 % — ABNORMAL HIGH (ref 11.5–15.5)
WBC: 3.6 10*3/uL — ABNORMAL LOW (ref 4.0–10.5)
nRBC: 0 % (ref 0.0–0.2)

## 2022-01-23 LAB — TROPONIN I (HIGH SENSITIVITY)
Troponin I (High Sensitivity): 4 ng/L (ref <18)
Troponin I (High Sensitivity): 3 ng/L (ref <18)

## 2022-01-23 LAB — D-DIMER, QUANTITATIVE: D-Dimer, Quant: 0.97 ug/mL-FEU — ABNORMAL HIGH (ref 0.00–0.50)

## 2022-01-23 LAB — PROTIME-INR
INR: 1.1 (ref 0.8–1.2)
Prothrombin Time: 13.8 seconds (ref 11.4–15.2)

## 2022-01-23 MED ORDER — IOHEXOL 350 MG/ML SOLN
20.0000 mL | Freq: Once | INTRAVENOUS | Status: AC | PRN
  Administered 2022-01-23: 20 mL via INTRAVENOUS

## 2022-01-23 MED ORDER — IOHEXOL 350 MG/ML SOLN
75.0000 mL | Freq: Once | INTRAVENOUS | Status: AC | PRN
  Administered 2022-01-23: 75 mL via INTRAVENOUS

## 2022-01-23 NOTE — ED Notes (Signed)
An After Visit Summary was printed and given to the patient. Discharge instructions given and no further questions at this time.  Pt leaving with family.  

## 2022-01-23 NOTE — ED Provider Notes (Signed)
[facility]-EMERGENCY DEPT Provider Note   CSN: [id number] Arrival date & time: 01/23/22  1048     History  Chief Complaint  Patient presents with   Chest Pain   Leg Pain   Urinary Frequency    Malik Boone is a 51 y.o. male with medical history of sciatica.  Patient presents to ED for evaluation of urinary frequency, chest pain and shortness of breath, left calf pain.  Patient reports left calf pain began 1 month ago.  Patient reports that this pain has intermittently worsened ever since this time.  Patient states initially thought was a sciatic nerve pain however the pain is now in his left calf.  Patient denies history of blood clots.  Patient also complaining of shortness of breath and chest pain that began 45 minutes prior to arrival.  Patient reports that chest pain is nonradiating and is associated with shortness of breath.  Patient also complaining of urinary frequency, denies dysuria.  Patient denies fevers, nausea or vomiting.     Chest Pain Associated symptoms: shortness of breath   Associated symptoms: no fever, no nausea and no vomiting   Leg Pain Associated symptoms: no fever   Urinary Frequency Associated symptoms include chest pain and shortness of breath.       Home Medications Prior to Admission medications   Medication Sig Start Date End Date Taking? Authorizing Provider  famotidine (PEPCID) 40 MG tablet Take 1 tablet (40 mg total) by mouth at bedtime. 08/01/20   Raspet, Noberto Retort, PA-C  ibuprofen (ADVIL) 800 MG tablet Take 1 tablet (800 mg total) by mouth every 8 (eight) hours as needed. 08/23/20   McDonald, Rachelle Hora, DPM  methylPREDNISolone (MEDROL DOSEPAK) 4 MG TBPK tablet Take as per manufacturer's instructions. 12/30/21   Wynetta Fines, MD  mupirocin ointment (BACTROBAN) 2 % Apply 1 application topically daily. 08/23/20   Edwin Cap, DPM      Allergies    Patient has no known allergies.    Review of Systems   Review of Systems   Constitutional:  Negative for fever.  Respiratory:  Positive for shortness of breath.   Cardiovascular:  Positive for chest pain.  Gastrointestinal:  Negative for nausea and vomiting.  Genitourinary:  Positive for frequency. Negative for dysuria.  Musculoskeletal:  Positive for myalgias.  All other systems reviewed and are negative.   Physical Exam Updated Vital Signs BP 119/83   Pulse (!) 55   Temp 98.3 F (36.8 C)   Resp 15   Ht 6\' 4"  (1.93 m)   Wt 77.1 kg   SpO2 100%   BMI 20.69 kg/m  Physical Exam Vitals and nursing note reviewed.  Constitutional:      General: He is not in acute distress.    Appearance: He is well-developed. He is not ill-appearing, toxic-appearing or diaphoretic.  HENT:     Head: Normocephalic and atraumatic.     Nose: Nose normal. No congestion.     Mouth/Throat:     Mouth: Mucous membranes are moist.     Pharynx: Oropharynx is clear.  Eyes:     Extraocular Movements: Extraocular movements intact.     Conjunctiva/sclera: Conjunctivae normal.     Pupils: Pupils are equal, round, and reactive to light.  Cardiovascular:     Rate and Rhythm: Normal rate.  Pulmonary:     Effort: Pulmonary effort is normal.     Breath sounds: Normal breath sounds. No wheezing.  Abdominal:  General: Abdomen is flat. Bowel sounds are normal.     Palpations: Abdomen is soft.     Tenderness: There is no abdominal tenderness.  Musculoskeletal:     Cervical back: Normal range of motion and neck supple. No tenderness.     Comments: Negative Denna Haggard' sign left calf  Skin:    General: Skin is warm and dry.     Capillary Refill: Capillary refill takes less than 2 seconds.  Neurological:     General: No focal deficit present.     Mental Status: He is alert and oriented to person, place, and time.     GCS: GCS eye subscore is 4. GCS verbal subscore is 5. GCS motor subscore is 6.     Cranial Nerves: Cranial nerves 2-12 are intact. No cranial nerve deficit.     Sensory:  Sensation is intact. No sensory deficit.     ED Results / Procedures / Treatments   Labs (all labs ordered are listed, but only abnormal results are displayed) Labs Reviewed  CBC - Abnormal; Notable for the following components:      Result Value   WBC 3.6 (*)    RDW 16.2 (*)    All other components within normal limits  URINALYSIS, ROUTINE W REFLEX MICROSCOPIC - Abnormal; Notable for the following components:   Specific Gravity, Urine <1.005 (*)    All other components within normal limits  D-DIMER, QUANTITATIVE - Abnormal; Notable for the following components:   D-Dimer, Quant 0.97 (*)    All other components within normal limits  BASIC METABOLIC PANEL  PROTIME-INR  TROPONIN I (HIGH SENSITIVITY)  TROPONIN I (HIGH SENSITIVITY)    EKG None  Radiology CT Angio Chest PE W and/or Wo Contrast  Result Date: 01/23/2022 CLINICAL DATA:  Pulmonary embolism suspected. EXAM: CT ANGIOGRAPHY CHEST WITH CONTRAST TECHNIQUE: Multidetector CT imaging of the chest was performed using the standard protocol during bolus administration of intravenous contrast. Multiplanar CT image reconstructions and MIPs were obtained to evaluate the vascular anatomy. RADIATION DOSE REDUCTION: This exam was performed according to the departmental dose-optimization program which includes automated exposure control, adjustment of the mA and/or kV according to patient size and/or use of iterative reconstruction technique. CONTRAST:  Approximately 90-95 cc OMNIPAQUE IOHEXOL 350 MG/ML SOLN (part of the initial contrast bolus leaked out of the IV) COMPARISON:  Chest x-ray 01/23/2022 FINDINGS: Cardiovascular: Satisfactory opacification of the pulmonary arteries to the segmental level. No evidence of pulmonary embolism. Normal heart size. No pericardial effusion. Mediastinum/Nodes: No enlarged mediastinal, hilar, or axillary lymph nodes. Thyroid gland, trachea, and esophagus demonstrate no significant findings. Lungs/Pleura:  Lungs are clear. No pleural effusion or pneumothorax. Upper Abdomen: There is early excretion of contrast in the kidneys, limiting evaluation for intrarenal stones. No acute abnormality in the UPPER abdomen. Musculoskeletal: No chest wall abnormality. No acute or significant osseous findings. Review of the MIP images confirms the above findings. IMPRESSION: 1. No evidence of pulmonary embolism. 2. Lungs are clear. Electronically Signed   By: Norva Pavlov M.D.   On: 01/23/2022 14:57   VAS [place] LOWER EXTREMITY VENOUS (DVT) (7a-7p)  Result Date: 01/23/2022  Lower Venous DVT Study Patient Name:  ZYRUS HETLAND  Date of Exam:   01/23/2022 Medical Rec #: [id number]     Accession #:    [id number] Date of Birth: Mar 27, 1970      Patient Gender: M Patient Age:   17 years Exam Location:  [facility] Procedure:      VAS  [place] LOWER EXTREMITY VENOUS (DVT) Referring Phys: Larrell Rapozo --------------------------------------------------------------------------------  Indications: Pain.  Risk Factors: None identified. Comparison Study: No prior studies. Performing Technologist: Chanda Busing RVT  Examination Guidelines: A complete evaluation includes B-mode imaging, spectral Doppler, color Doppler, and power Doppler as needed of all accessible portions of each vessel. Bilateral testing is considered an integral part of a complete examination. Limited examinations for reoccurring indications may be performed as noted. The reflux portion of the exam is performed with the patient in reverse Trendelenburg.  +-----+---------------+---------+-----------+----------+--------------+ RIGHTCompressibilityPhasicitySpontaneityPropertiesThrombus Aging +-----+---------------+---------+-----------+----------+--------------+ CFV  Full           Yes      Yes                                 +-----+---------------+---------+-----------+----------+--------------+    +---------+---------------+---------+-----------+----------+--------------+ LEFT     CompressibilityPhasicitySpontaneityPropertiesThrombus Aging +---------+---------------+---------+-----------+----------+--------------+ CFV      Full           Yes      Yes                                 +---------+---------------+---------+-----------+----------+--------------+ SFJ      Full                                                        +---------+---------------+---------+-----------+----------+--------------+ FV Prox  Full                                                        +---------+---------------+---------+-----------+----------+--------------+ FV Mid   Full                                                        +---------+---------------+---------+-----------+----------+--------------+ FV DistalFull                                                        +---------+---------------+---------+-----------+----------+--------------+ PFV      Full                                                        +---------+---------------+---------+-----------+----------+--------------+ POP      Full           Yes      Yes                                 +---------+---------------+---------+-----------+----------+--------------+ PTV      Full                                                        +---------+---------------+---------+-----------+----------+--------------+  PERO     Full                                                        +---------+---------------+---------+-----------+----------+--------------+     Summary: RIGHT: - No evidence of common femoral vein obstruction.  LEFT: - There is no evidence of deep vein thrombosis in the lower extremity.  - No cystic structure found in the popliteal fossa.  *See table(s) above for measurements and observations.    Preliminary    DG Chest 2 View  Result Date: 01/23/2022 CLINICAL DATA:  Left-sided chest  pain and shortness of breath earlier today. EXAM: CHEST - 2 VIEW COMPARISON:  None Available. FINDINGS: The heart size and mediastinal contours are normal. The lungs are clear. There is no pleural effusion or pneumothorax. No acute osseous findings are identified. Telemetry leads overlie the chest. IMPRESSION: No active cardiopulmonary process. Electronically Signed   By: Carey BullocksWilliam  Veazey M.D.   On: 01/23/2022 12:24    Procedures Procedures   Medications Ordered in ED Medications  iohexol (OMNIPAQUE) 350 MG/ML injection 75 mL (75 mLs Intravenous Contrast Given 01/23/22 1423)  iohexol (OMNIPAQUE) 350 MG/ML injection 20 mL (20 mLs Intravenous Contrast Given 01/23/22 1433)    ED Course/ Medical Decision Making/ A&P                           Medical Decision Making Amount and/or Complexity of Data Reviewed Labs: ordered. Radiology: ordered.  Risk Prescription drug management.   51 year old presents to ED for evaluation.  Please see HPI for further details.  On examination patient afebrile and nontachycardic.  Patient lung sounds clear bilaterally, he is not hypoxic.  Patient abdomen soft and compressible throughout.  Patient left calf with negative Homans' sign, no overlying skin change.  Patient workup will include CBC, BMP, D-dimer, PT/INR, troponin x 2, urinalysis, ultrasound of left lower extremity, chest x-ray, EKG.  Patient CBC unremarkable without leukocytosis or anemia.  Patient BMP is unremarkable.  The patient urinalysis is unremarkable, no nitrates.  Patient PT/INR within normal limits.  Patient troponin initially 4, delta 3.  The patient D-dimer is elevated at 0.97.  EKG nonischemic.  Chest x-ray shows no consolidations or effusions.  Patient ultrasound left lower extremity negative for DVT.  Due to elevated D-dimer, CT angiogram was ordered at this time.  CT angio shows no evidence of PE.  At this time, patient workup reassuring.  The patient will be advised to follow back up  with his PCP for further management.  Patient advised to return to the ED with any new or worsening signs or symptoms such as increased chest pain or shortness of breath.  The patient voiced understanding of instructions.  Patient had all questions answered to his satisfaction.  The patient is stable discharge.  Final Clinical Impression(s) / ED Diagnoses Final diagnoses:  Urinary frequency  Chest pain, unspecified type  Left leg pain  Shortness of breath    Rx / DC Orders ED Discharge Orders     None         Clent RidgesGroce, Laree Garron F, PA-C 01/23/22 1512    Benjiman CorePickering, Nathan, MD 01/23/22 [phone]

## 2022-01-23 NOTE — ED Triage Notes (Addendum)
Patient reports that he began having left chest pain and SOB approx 45 minutes ago.  Patient continues to c/o left calf pain and states it is tender, but denies any swelling.  Patient also reports urinary frequency.

## 2022-01-23 NOTE — ED Provider Triage Note (Signed)
Emergency Medicine Provider Triage Evaluation Note  Malik Boone , a 51 y.o. male  was evaluated in triage.  Pt complains of urinary frequency, chest pain and shortness of breath, left calf pain.  Patient reports left calf pain began 1 month ago.  Patient reports that this Pain has intermittently worsened ever since this time.  Patient states initially thought was a sciatic nerve pain however the pain is now in his left calf.  Patient denies history of blood clots.  Patient also complaining of shortness of breath and chest pain that began 45 minutes prior to arrival.  Patient reports that chest pain is nonradiating and is associated with shortness of breath.  Patient also complaining of urinary frequency, denies dysuria.  Patient denies fevers, nausea or vomiting.  Review of Systems  Positive:  Negative:   Physical Exam  BP (!) 140/92 (BP Location: Left Arm)   Pulse 84   Temp 98.3 F (36.8 C) (Oral)   Resp 19   Ht 6\' 4"  (1.93 m)   Wt 77.1 kg   SpO2 100%   BMI 20.69 kg/m  Gen:   Awake, no distress   Resp:  Normal effort  MSK:   Moves extremities without difficulty  Other:    Medical Decision Making  Medically screening exam initiated at 11:27 AM.  Appropriate orders placed.  Derald Tomko was informed that the remainder of the evaluation will be completed by another provider, this initial triage assessment does not replace that evaluation, and the importance of remaining in the ED until their evaluation is complete.     Kizzie Bane, PA-C 01/23/22 1128

## 2022-01-23 NOTE — Discharge Instructions (Addendum)
Return to the ED with any new or worsening signs or symptoms such as increased chest pain or shortness of breath Please follow-up with your PCP for reevaluation

## 2022-01-23 NOTE — Progress Notes (Signed)
Left lower extremity venous duplex has been completed. Preliminary results can be found in CV Proc through chart review.  Results were given to Delice Bison PA.  01/23/22 12:20 PM Olen Cordial RVT

## 2022-02-01 ENCOUNTER — Ambulatory Visit
Admission: RE | Admit: 2022-02-01 | Discharge: 2022-02-01 | Disposition: A | Discharge status: Home / Self Care | Payer: No Typology Code available for payment source | Source: Ambulatory Visit | Attending: Internal Medicine | Admitting: Internal Medicine

## 2022-02-01 DIAGNOSIS — M5430 Sciatica, unspecified side: Secondary | ICD-10-CM

## 2022-09-27 IMAGING — DX DG FOOT COMPLETE 3+V*R*
3 series · 3 of 3 positions shown · non-contrast
Comparison: None.

CLINICAL DATA: Right foot pain for the past week.

EXAM:
RIGHT FOOT COMPLETE - 3+ VIEW

[foot ap]
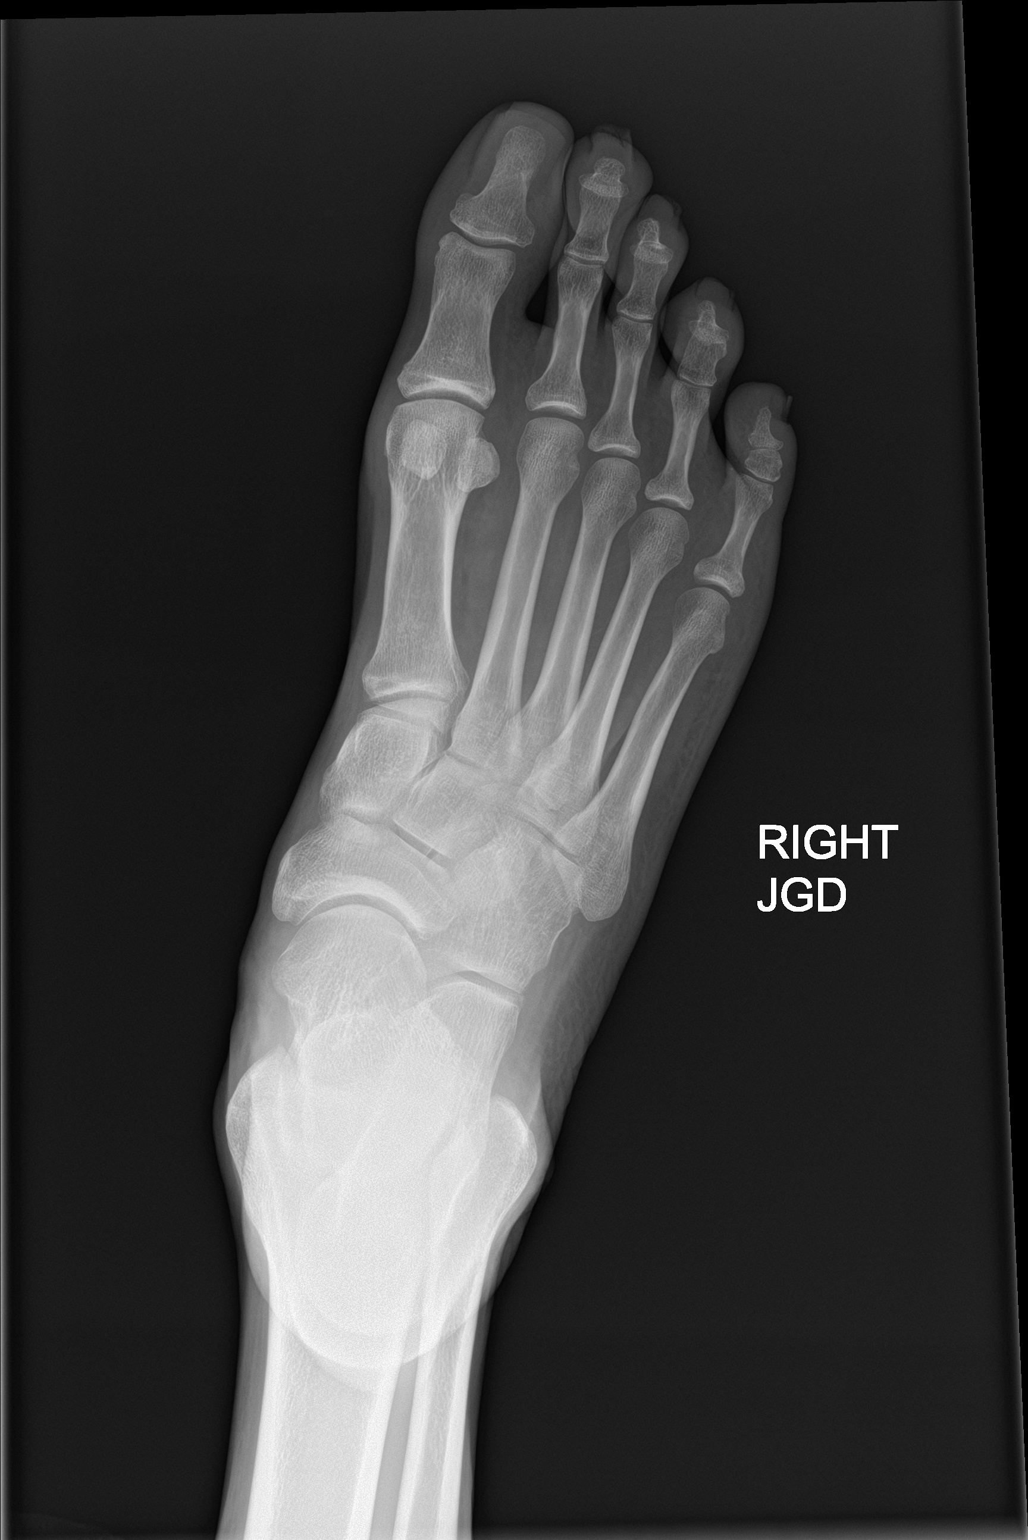

[foot obl]
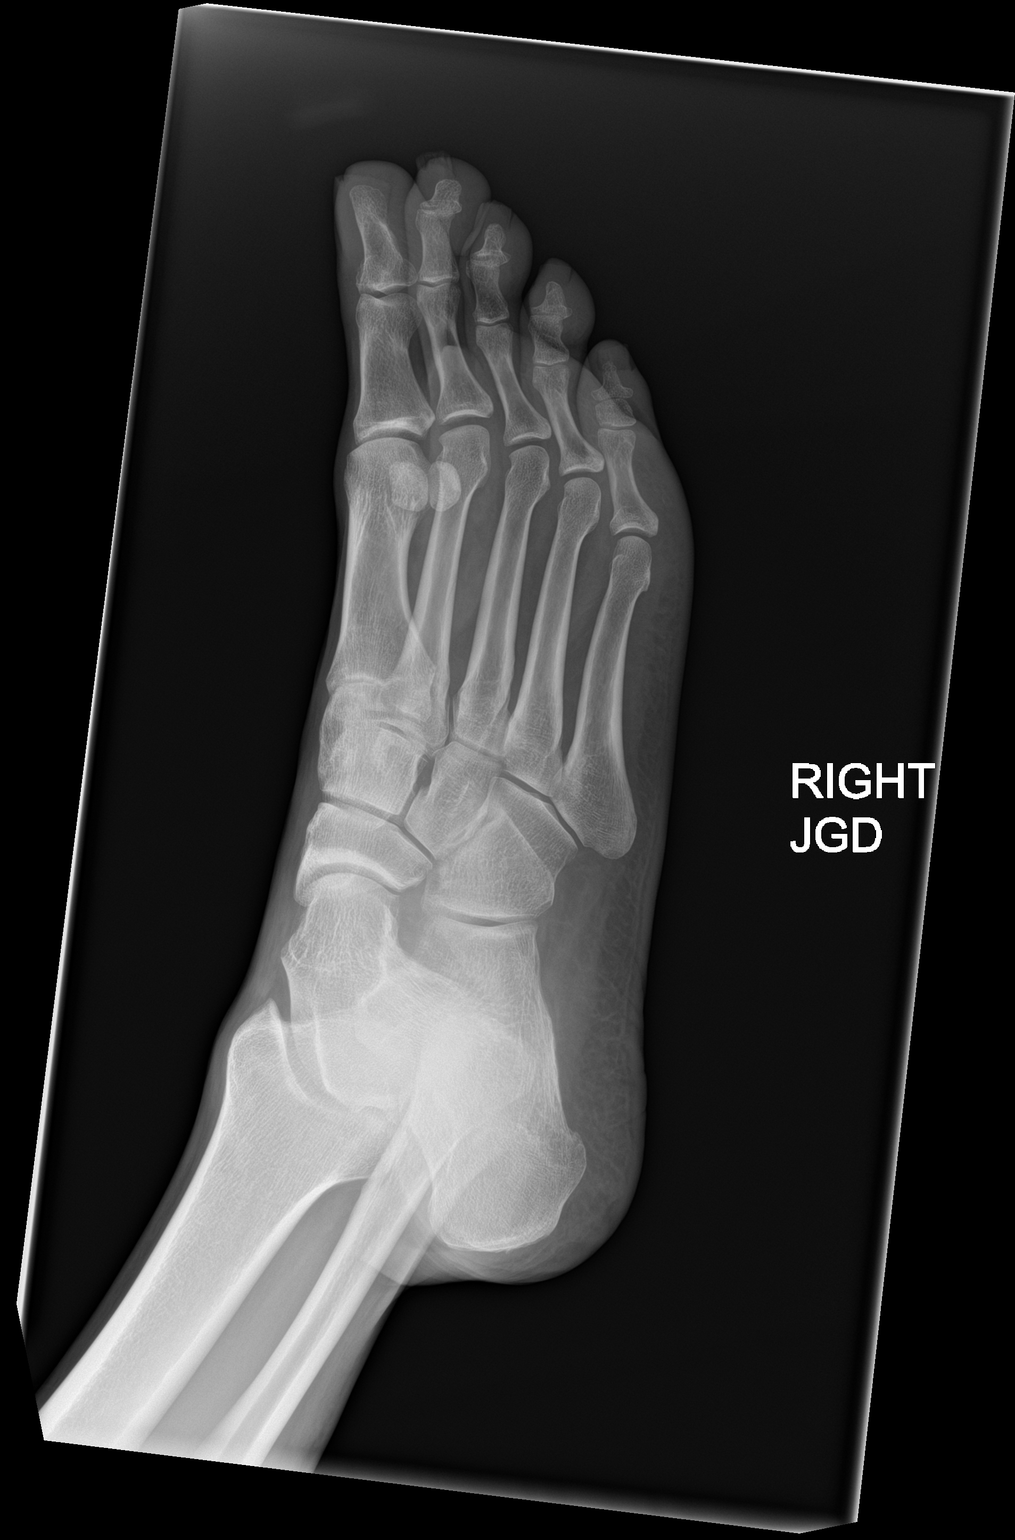

[foot lat]
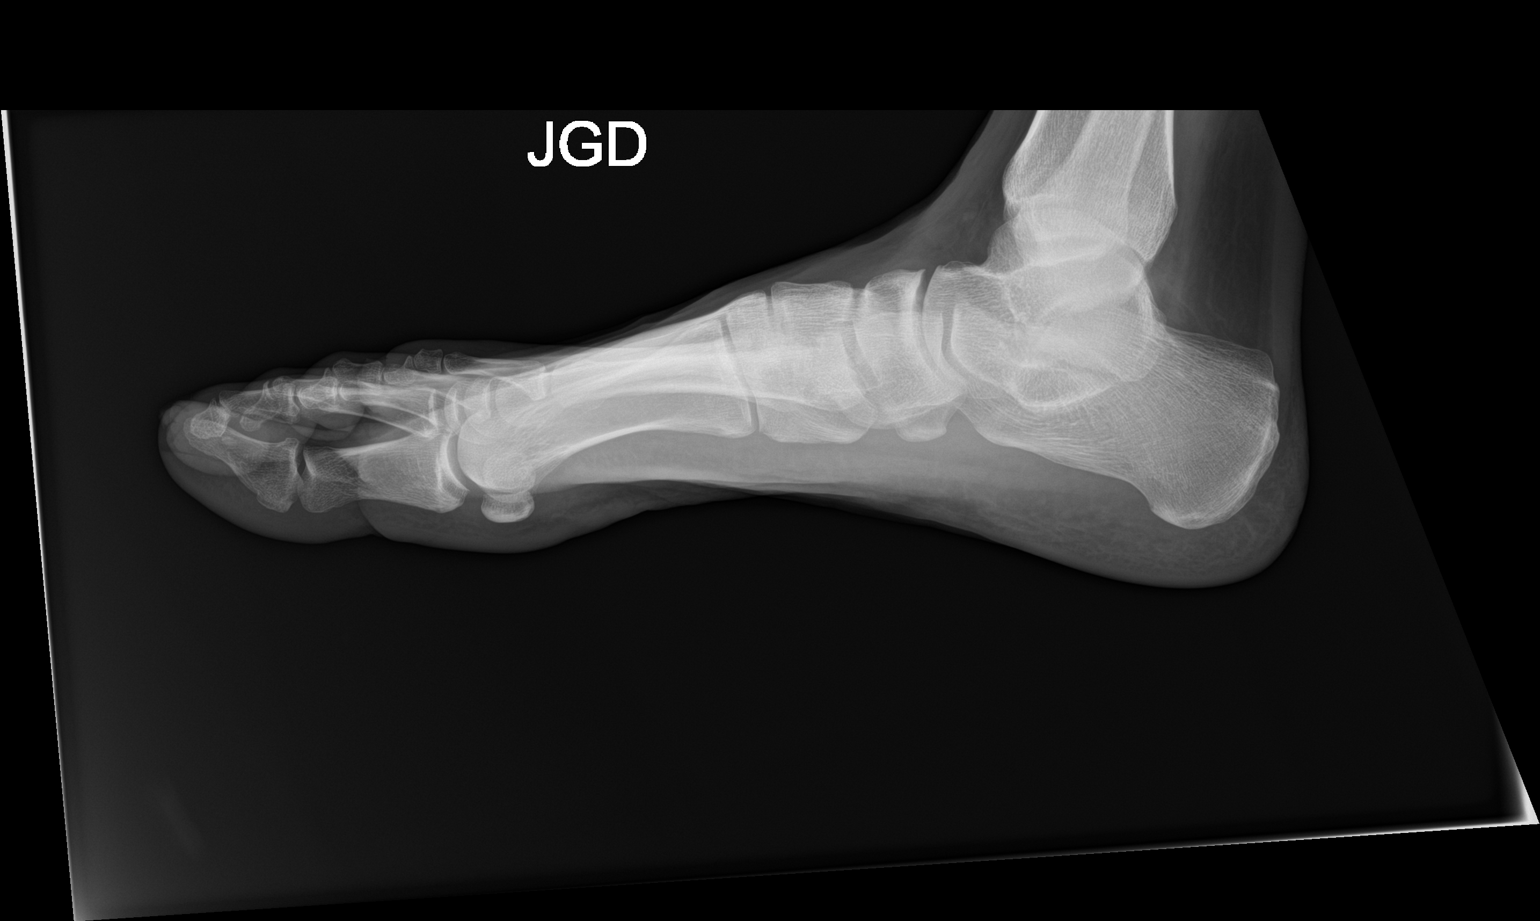

[3 of 3 positions shown; findings below may reference images not displayed]

FINDINGS: There is no evidence of fracture or dislocation. There is no
evidence of arthropathy or other focal bone abnormality. Soft
tissues are unremarkable. No radiopaque foreign body identified.
IMPRESSION: Negative.
